# Patient Record
Sex: Male | Born: 1940 | Race: White | Hispanic: No | Marital: Married | Smoking: Never smoker
Health system: Southern US, Community
[De-identification: ages and names within clinical notes are randomized; demographics above are authoritative.]

---

## 2000-12-26 ENCOUNTER — Ambulatory Visit (HOSPITAL_COMMUNITY): Admission: RE | Admit: 2000-12-26 | Discharge: 2000-12-26 | Payer: Self-pay | Admitting: Gastroenterology

## 2019-12-01 ENCOUNTER — Other Ambulatory Visit: Payer: Self-pay

## 2019-12-01 ENCOUNTER — Other Ambulatory Visit
Admission: RE | Admit: 2019-12-01 | Discharge: 2019-12-01 | Disposition: A | Discharge status: Home / Self Care | Payer: Managed Care, Other (non HMO) | Source: Ambulatory Visit | Attending: Cardiology | Admitting: Cardiology

## 2019-12-01 DIAGNOSIS — Z20822 Contact with and (suspected) exposure to covid-19: Secondary | ICD-10-CM | POA: Diagnosis not present

## 2019-12-01 DIAGNOSIS — Z01812 Encounter for preprocedural laboratory examination: Secondary | ICD-10-CM | POA: Diagnosis not present

## 2019-12-02 LAB — SARS CORONAVIRUS 2 (TAT 6-24 HRS): SARS Coronavirus 2: NEGATIVE

## 2019-12-03 MED ORDER — SODIUM CHLORIDE 0.9 % IV SOLN
Freq: Once | INTRAVENOUS | Status: DC
  Filled 2019-12-03: qty 2

## 2019-12-04 ENCOUNTER — Other Ambulatory Visit: Payer: Self-pay

## 2019-12-04 ENCOUNTER — Observation Stay: Payer: 59

## 2019-12-04 ENCOUNTER — Encounter
Admission: AD | Disposition: A | Discharge status: Home / Self Care | Payer: Self-pay | Source: Home / Self Care | Attending: Cardiology

## 2019-12-04 ENCOUNTER — Ambulatory Visit: Payer: 59 | Admitting: Anesthesiology

## 2019-12-04 ENCOUNTER — Ambulatory Visit: Payer: 59

## 2019-12-04 ENCOUNTER — Observation Stay
Admission: AD | Admit: 2019-12-04 | Discharge: 2019-12-05 | Disposition: A | Discharge status: Home / Self Care | Payer: 59 | Attending: Cardiology | Admitting: Cardiology

## 2019-12-04 ENCOUNTER — Encounter: Payer: Self-pay | Admitting: Cardiology

## 2019-12-04 DIAGNOSIS — Z79899 Other long term (current) drug therapy: Secondary | ICD-10-CM | POA: Diagnosis not present

## 2019-12-04 DIAGNOSIS — Z87891 Personal history of nicotine dependence: Secondary | ICD-10-CM | POA: Insufficient documentation

## 2019-12-04 DIAGNOSIS — I429 Cardiomyopathy, unspecified: Secondary | ICD-10-CM | POA: Insufficient documentation

## 2019-12-04 DIAGNOSIS — Z8249 Family history of ischemic heart disease and other diseases of the circulatory system: Secondary | ICD-10-CM | POA: Diagnosis not present

## 2019-12-04 DIAGNOSIS — I495 Sick sinus syndrome: Principal | ICD-10-CM | POA: Diagnosis present

## 2019-12-04 DIAGNOSIS — I4891 Unspecified atrial fibrillation: Secondary | ICD-10-CM | POA: Diagnosis not present

## 2019-12-04 DIAGNOSIS — I4892 Unspecified atrial flutter: Secondary | ICD-10-CM | POA: Insufficient documentation

## 2019-12-04 DIAGNOSIS — Z7901 Long term (current) use of anticoagulants: Secondary | ICD-10-CM | POA: Insufficient documentation

## 2019-12-04 DIAGNOSIS — I491 Atrial premature depolarization: Secondary | ICD-10-CM | POA: Diagnosis not present

## 2019-12-04 DIAGNOSIS — Z950 Presence of cardiac pacemaker: Secondary | ICD-10-CM

## 2019-12-04 DIAGNOSIS — M069 Rheumatoid arthritis, unspecified: Secondary | ICD-10-CM | POA: Diagnosis not present

## 2019-12-04 HISTORY — PX: PACEMAKER INSERTION: SHX728

## 2019-12-04 SURGERY — INSERTION, CARDIAC PACEMAKER
Anesthesia: Choice | Laterality: Left

## 2019-12-04 MED ORDER — LACTATED RINGERS IV SOLN: INTRAVENOUS | Status: DC

## 2019-12-04 MED ORDER — PROPOFOL 500 MG/50ML IV EMUL
INTRAVENOUS | Status: DC | PRN
  Administered 2019-12-04 (×2): 20 ug via INTRAVENOUS

## 2019-12-04 MED ORDER — EPHEDRINE SULFATE 50 MG/ML IJ SOLN
INTRAMUSCULAR | Status: DC | PRN
  Administered 2019-12-04: 7.5 mg via INTRAVENOUS

## 2019-12-04 MED ORDER — PROPOFOL 10 MG/ML IV BOLUS
INTRAVENOUS | Status: AC
  Filled 2019-12-04: qty 20

## 2019-12-04 MED ORDER — METOPROLOL SUCCINATE ER 25 MG PO TB24
25.0000 mg | ORAL_TABLET | Freq: Every day | ORAL | Status: DC
  Administered 2019-12-04 – 2019-12-05 (×2): 25 mg via ORAL
  Filled 2019-12-04 (×2): qty 1

## 2019-12-04 MED ORDER — SODIUM CHLORIDE 0.9 % IV SOLN: INTRAVENOUS | Status: DC

## 2019-12-04 MED ORDER — FAMOTIDINE 20 MG PO TABS
ORAL_TABLET | ORAL | Status: AC
  Administered 2019-12-04: 20 mg via ORAL
  Filled 2019-12-04: qty 1

## 2019-12-04 MED ORDER — OXYCODONE HCL 5 MG/5ML PO SOLN: 5.0000 mg | Freq: Once | ORAL | Status: DC | PRN

## 2019-12-04 MED ORDER — CEFAZOLIN SODIUM-DEXTROSE 2-4 GM/100ML-% IV SOLN: 2.0000 g | Freq: Once | INTRAVENOUS | Status: DC

## 2019-12-04 MED ORDER — CEFAZOLIN SODIUM-DEXTROSE 2-4 GM/100ML-% IV SOLN
INTRAVENOUS | Status: AC
  Filled 2019-12-04: qty 100

## 2019-12-04 MED ORDER — LIDOCAINE 1 % OPTIME INJ - NO CHARGE
INTRAMUSCULAR | Status: DC | PRN
  Administered 2019-12-04: 30 mL

## 2019-12-04 MED ORDER — ONDANSETRON HCL 4 MG/2ML IJ SOLN: 4.0000 mg | Freq: Four times a day (QID) | INTRAMUSCULAR | Status: DC | PRN

## 2019-12-04 MED ORDER — FENTANYL CITRATE (PF) 100 MCG/2ML IJ SOLN
INTRAMUSCULAR | Status: DC | PRN
  Administered 2019-12-04 (×2): 25 ug via INTRAVENOUS

## 2019-12-04 MED ORDER — PROPOFOL 500 MG/50ML IV EMUL
INTRAVENOUS | Status: DC | PRN
  Administered 2019-12-04: 50 ug/kg/min via INTRAVENOUS

## 2019-12-04 MED ORDER — ONDANSETRON HCL 4 MG/2ML IJ SOLN: 4.0000 mg | Freq: Once | INTRAMUSCULAR | Status: DC | PRN

## 2019-12-04 MED ORDER — OXYCODONE HCL 5 MG PO TABS: 5.0000 mg | ORAL_TABLET | Freq: Once | ORAL | Status: DC | PRN

## 2019-12-04 MED ORDER — MIDAZOLAM HCL 2 MG/2ML IJ SOLN
INTRAMUSCULAR | Status: AC
  Filled 2019-12-04: qty 2

## 2019-12-04 MED ORDER — ACETAMINOPHEN 10 MG/ML IV SOLN: 1000.0000 mg | Freq: Once | INTRAVENOUS | Status: DC | PRN

## 2019-12-04 MED ORDER — GENTAMICIN SULFATE 40 MG/ML IJ SOLN
INTRAMUSCULAR | Status: AC
  Filled 2019-12-04: qty 2

## 2019-12-04 MED ORDER — FAMOTIDINE 20 MG PO TABS: 20.0000 mg | ORAL_TABLET | Freq: Once | ORAL | Status: AC

## 2019-12-04 MED ORDER — FENTANYL CITRATE (PF) 100 MCG/2ML IJ SOLN
INTRAMUSCULAR | Status: AC
  Filled 2019-12-04: qty 2

## 2019-12-04 MED ORDER — CEFAZOLIN SODIUM-DEXTROSE 2-4 GM/100ML-% IV SOLN: 2.0000 g | INTRAVENOUS | Status: DC

## 2019-12-04 MED ORDER — MIDAZOLAM HCL 2 MG/2ML IJ SOLN
INTRAMUSCULAR | Status: DC | PRN
  Administered 2019-12-04: .5 mg via INTRAVENOUS

## 2019-12-04 MED ORDER — CEFAZOLIN SODIUM-DEXTROSE 1-4 GM/50ML-% IV SOLN
1.0000 g | Freq: Four times a day (QID) | INTRAVENOUS | Status: AC
  Administered 2019-12-04 – 2019-12-05 (×3): 1 g via INTRAVENOUS
  Filled 2019-12-04 (×3): qty 50

## 2019-12-04 MED ORDER — ACETAMINOPHEN 325 MG PO TABS: 325.0000 mg | ORAL_TABLET | ORAL | Status: DC | PRN

## 2019-12-04 MED ORDER — SODIUM CHLORIDE 0.9 % IV SOLN
INTRAVENOUS | Status: DC | PRN
  Administered 2019-12-04: 12:00:00 500 mL

## 2019-12-04 MED ORDER — FENTANYL CITRATE (PF) 100 MCG/2ML IJ SOLN: 25.0000 ug | INTRAMUSCULAR | Status: DC | PRN

## 2019-12-04 MED ORDER — LIDOCAINE HCL (CARDIAC) PF 100 MG/5ML IV SOSY
PREFILLED_SYRINGE | INTRAVENOUS | Status: DC | PRN
  Administered 2019-12-04: 60 mg via INTRAVENOUS

## 2019-12-04 MED ORDER — PROPOFOL 500 MG/50ML IV EMUL
INTRAVENOUS | Status: AC
  Filled 2019-12-04: qty 50

## 2019-12-04 SURGICAL SUPPLY — 38 items
BAG DECANTER FOR FLEXI CONT (MISCELLANEOUS) ×2 IMPLANT
BRUSH SCRUB EZ  4% CHG (MISCELLANEOUS) ×1
BRUSH SCRUB EZ 4% CHG (MISCELLANEOUS) ×1 IMPLANT
CABLE SURG 12 DISP A/V CHANNEL (MISCELLANEOUS) ×4 IMPLANT
CANISTER SUCT 1200ML W/VALVE (MISCELLANEOUS) ×2 IMPLANT
CHLORAPREP W/TINT 26 (MISCELLANEOUS) ×2 IMPLANT
COVER LIGHT HANDLE STERIS (MISCELLANEOUS) ×4 IMPLANT
COVER MAYO STAND REUSABLE (DRAPES) ×2 IMPLANT
COVER WAND RF STERILE (DRAPES) ×2 IMPLANT
DRAPE C-ARM XRAY 36X54 (DRAPES) ×2 IMPLANT
DRSG TEGADERM 4X4.75 (GAUZE/BANDAGES/DRESSINGS) ×2 IMPLANT
DRSG TELFA 4X3 1S NADH ST (GAUZE/BANDAGES/DRESSINGS) ×2 IMPLANT
ELECT REM PT RETURN 9FT ADLT (ELECTROSURGICAL) ×2
ELECTRODE REM PT RTRN 9FT ADLT (ELECTROSURGICAL) ×1 IMPLANT
GLIDEWIRE STIFF .35X180X3 HYDR (WIRE) IMPLANT
GLOVE BIO SURGEON STRL SZ7.5 (GLOVE) ×2 IMPLANT
GLOVE BIO SURGEON STRL SZ8 (GLOVE) ×2 IMPLANT
GOWN STRL REUS W/ TWL LRG LVL3 (GOWN DISPOSABLE) ×1 IMPLANT
GOWN STRL REUS W/ TWL XL LVL3 (GOWN DISPOSABLE) ×1 IMPLANT
GOWN STRL REUS W/TWL LRG LVL3 (GOWN DISPOSABLE) ×1
GOWN STRL REUS W/TWL XL LVL3 (GOWN DISPOSABLE) ×1
IMMOBILIZER SHDR MD LX WHT (SOFTGOODS) IMPLANT
IMMOBILIZER SHDR XL LX WHT (SOFTGOODS) ×2 IMPLANT
INTRO PACEMAKR LEAD 9FR 13CM (INTRODUCER) ×4
INTRO PACEMKR SHEATH II 7FR (MISCELLANEOUS) ×4
INTRODUCER PACEMKR LD 9FR 13CM (INTRODUCER) ×2 IMPLANT
INTRODUCER PACEMKR SHTH II 7FR (MISCELLANEOUS) ×2 IMPLANT
IV NS 500ML (IV SOLUTION) ×1
IV NS 500ML BAXH (IV SOLUTION) ×1 IMPLANT
KIT TURNOVER KIT A (KITS) ×2 IMPLANT
LABEL OR SOLS (LABEL) ×2 IMPLANT
LEAD INGEVITY 7841 52 (Lead) ×2 IMPLANT
LEAD INGEVITY 7842 59 (Lead) ×2 IMPLANT
MARKER SKIN DUAL TIP RULER LAB (MISCELLANEOUS) ×2 IMPLANT
PACEMAKER ACCOLADE GR (Pacemaker) ×2 IMPLANT
PACK PACE INSERTION (MISCELLANEOUS) ×2 IMPLANT
PAD ONESTEP ZOLL R SERIES ADT (MISCELLANEOUS) ×2 IMPLANT
SUT SILK 0 SH 30 (SUTURE) ×6 IMPLANT

## 2019-12-04 NOTE — Anesthesia Procedure Notes (Signed)
Procedure Name: MAC Date/Time: 12/04/2019 12:15 PM Performed by: Allean Found, CRNA Pre-anesthesia Checklist: Emergency Drugs available, Suction available, Patient being monitored, Timeout performed and Patient identified Patient Re-evaluated:Patient Re-evaluated prior to induction Oxygen Delivery Method: Nasal cannula Placement Confirmation: positive ETCO2

## 2019-12-04 NOTE — Interval H&P Note (Signed)
Patient with marked sinus pauses requires dual chamber pacemaker.

## 2019-12-04 NOTE — Anesthesia Postprocedure Evaluation (Signed)
Anesthesia Post Note  Patient: Raymond Case  Procedure(s) Performed: INSERTION PACEMAKER (Left )  Patient location during evaluation: PACU Anesthesia Type: General Level of consciousness: awake and alert Pain management: pain level controlled Vital Signs Assessment: post-procedure vital signs reviewed and stable Respiratory status: spontaneous breathing, nonlabored ventilation, respiratory function stable and patient connected to nasal cannula oxygen Cardiovascular status: blood pressure returned to baseline and stable Postop Assessment: no apparent nausea or vomiting Anesthetic complications: no     Last Vitals:  Vitals:   12/04/19 1500 12/04/19 1505  BP:  132/83  Pulse: 68 (!) 59  Resp: 17 19  Temp:  36.4 C  SpO2: 100% 100%    Last Pain:  Vitals:   12/04/19 1500  TempSrc:   PainSc: 0-No pain                 Corinda Gubler

## 2019-12-04 NOTE — Plan of Care (Signed)
  Problem: Education: Goal: Understanding of CV disease, CV risk reduction, and recovery process will improve Outcome: Progressing   Problem: Cardiovascular: Goal: Vascular access site(s) Level 0-1 will be maintained Outcome: Progressing   

## 2019-12-04 NOTE — Anesthesia Preprocedure Evaluation (Signed)
Anesthesia Evaluation  Patient identified by MRN, date of birth, ID band Patient awake    Reviewed: Allergy & Precautions, NPO status , Patient's Chart, lab work & pertinent test results  History of Anesthesia Complications Negative for: history of anesthetic complications  Airway Mallampati: II  TM Distance: >3 FB Neck ROM: Full    Dental no notable dental hx. (+) Teeth Intact   Pulmonary neg pulmonary ROS, neg sleep apnea, neg COPD, Patient abstained from smoking.Not current smoker,    Pulmonary exam normal breath sounds clear to auscultation       Cardiovascular Exercise Tolerance: Good METS(-) hypertension+CHF  (-) CAD and (-) Past MI + dysrhythmias Atrial Fibrillation  Rhythm:Regular Rate:Normal - Systolic murmurs    Neuro/Psych negative neurological ROS  negative psych ROS   GI/Hepatic neg GERD  ,(+)     (-) substance abuse  ,   Endo/Other  neg diabetes  Renal/GU negative Renal ROS     Musculoskeletal  (+) Arthritis , Rheumatoid disorders,    Abdominal   Peds  Hematology   Anesthesia Other Findings History reviewed. No pertinent past medical history.  Reproductive/Obstetrics                            Anesthesia Physical Anesthesia Plan  ASA: III  Anesthesia Plan: General   Post-op Pain Management:    Induction: Intravenous  PONV Risk Score and Plan: 2 and Ondansetron and Dexamethasone  Airway Management Planned: Natural Airway and Nasal Cannula  Additional Equipment: None  Intra-op Plan:   Post-operative Plan: Extubation in OR  Informed Consent: I have reviewed the patients History and Physical, chart, labs and discussed the procedure including the risks, benefits and alternatives for the proposed anesthesia with the patient or authorized representative who has indicated his/her understanding and acceptance.     Dental advisory given  Plan Discussed with: CRNA  and Surgeon  Anesthesia Plan Comments: (Discussed risks of anesthesia with patient, including PONV, sore throat, lip/dental damage. Rare risks discussed as well, such as cardiorespiratory sequelae. Patient understands.)        Anesthesia Quick Evaluation

## 2019-12-04 NOTE — Transfer of Care (Signed)
Immediate Anesthesia Transfer of Care Note  Patient: Raymond Case  Procedure(s) Performed: INSERTION PACEMAKER (Left )  Patient Location: PACU  Anesthesia Type:MAC  Level of Consciousness: awake, alert  and oriented  Airway & Oxygen Therapy: Patient Spontanous Breathing and Patient connected to nasal cannula oxygen  Post-op Assessment: Report given to RN and Post -op Vital signs reviewed and stable  Post vital signs: Reviewed and stable  Last Vitals:  Vitals Value Taken Time  BP 130/94 12/04/19 1331  Temp 36.3 C 12/04/19 1330  Pulse 65 12/04/19 1338  Resp 22 12/04/19 1338  SpO2 100 % 12/04/19 1338  Vitals shown include unvalidated device data.  Last Pain:  Vitals:   12/04/19 1039  TempSrc: Temporal  PainSc: 0-No pain      Patients Stated Pain Goal: 0 (58/83/25 4982)  Complications: No apparent anesthesia complications

## 2019-12-04 NOTE — H&P (Signed)
Jump to Section ? Document InformationEncounter DetailsImaging ResultsLast Filed Vital SignsMiscellaneous NotesPatient ContactsPatient DemographicsPlan of TreatmentProgress NotesReason for VisitSocial HistoryVisit Diagnoses Raymond Case Encounter Summary, generated on Feb. 03, 2021February 03, 2021 Printout Information  Document Contents Document Received Date Document Source Organization  Office Visit Feb. 03, 2021February 03, 2021 [place]   Patient Demographics - 79 y.o. Male; born [birth year]  Patient Address Communication Language Race / Ethnicity Marital Status  [address] [place], [place] [address] [phone] [facility]) [phone] [facility]) [phone] (Work) [email or web address] English (Preferred) Dema Severin / Not Hispanic or Latino Married   Reason for Visit  Reason Comments  Follow-up holter   Encounter Details  Date Type Department Care Team Description  12/01/2019 Office Visit [facility]  [place], [place] [address]  [phone]  Isaias Cowman, MD  [place]  [facility]  [place], [place] [address]  [phone]  [phone] (Fax)  Atrial flutter with controlled response (CMS-HCC) (Primary Dx);  Cardiomyopathy, unspecified type (CMS-HCC);  Palpitations;  Status post catheter ablation of atrial flutter;  Sinus pause;  Pre-op exam   Social History - documented as of this encounter Tobacco Use Types Packs/Day Years Used Date  Former Smoker Cigarettes 1.5 20 Quit: 10/10/1981  Smokeless Tobacco: Never Used      Alcohol Use Drinks/Week oz/Week Comments  Yes 7 Glasses of wine  7.0    Sex Assigned at Birth Date Recorded  Male 05/26/2018 8:59 AM EDT   COVID-19 Exposure Response Date Recorded  In the last month, have you been in contact with someone who was confirmed or suspected to have Coronavirus / COVID-19? No / Unsure 12/01/2019  1:38 PM EST   Last Filed Vital Signs - documented in this encounter Vital Sign Reading Time Taken Comments  Blood Pressure 112/70 12/01/2019 1:58 PM EST   Pulse 73 12/01/2019 1:58 PM EST   Temperature - -   Respiratory Rate - -   Oxygen Saturation 95% 12/01/2019 1:58 PM EST   Inhaled Oxygen Concentration - -   Weight 87.9 kg (193 lb 12.8 oz) 12/01/2019 1:58 PM EST   Height 188 cm (6' 2" ) 12/01/2019 1:58 PM EST   Body Mass Index 24.88 12/01/2019 1:58 PM EST    Progress Notes - documented in this encounter Isaias Cowman, MD - 12/01/2019 2:00 PM EST Formatting of this note might be different from the original. Established Patient Visit   Chief Complaint: Chief Complaint  Patient presents with  . Follow-up  holter  Date of Service: 12/01/2019 Date of Birth: 1941/10/13 PCP: Valera Castle, MD  History of Present Illness: Raymond Case is a 79 y.o.male patient who returns for  1. Chronic atrial flutter 2. Mildly reduced left ventricular function 3. Rheumatoid arthritis 4. AFL Catheter ablation 11/12/2019 5. Sinus pauses  The patient reported a recent history of elevated heart rate with associated shortness of breath. He had been evaluated at [place] urgent care for Covid testing which was negative due to having shortness of breath and cough. ECG on 10/09/2019 revealed atrial flutter with variable conduction at a rate of 97 bpm. The patient was evaluated by Dr. Mylinda Latina on 10/17/2019. 72-hr Holter monitor starting on 10/17/2019 revealed predominant atrial flutter with variable rate with mean heart rate of 99 bpm with heart rate ranging from 76 to 139 bpm. There were occasional premature ventricular contractions. The patient states that the patient-recorded events were in error. 2D echocardiogram 06/26/2017 revealed LVEF of  40% with mild aortic insufficiency and tricuspid regurgitation.  The patient underwent successful catheter ablation on 11/12/2019.  Patient reports he felt good for several days, but then starting on 11/16/2019 he started experiencing intermittent episodes of apparent lightheadedness, "like a wave going across the top of my head". Denies presyncope or syncope. He called Dr. Manon Hilding office, apparently went to [facility] urgent care in [place] where EKG was performed which only revealed sinus rhythm. 72-hour Holter monitor was placed 11/18/2019, results pending. He was seen in the office 11/28/2019.Marland Kitchen ECG revealed sinus rhythm, with sinus arrhythmia, with premature atrial contractions.   Patient returns today, reports doing about the same. Patient has continued to have episodes of lightheadedness, described as a "gray veil coming down over his eyes", and episodes where he has "wave flowing from the back to the front of his head". He denies syncope. He denies exertional chest pain or shortness of breath. He denies peripheral edema. 72-hour Holter monitor revealed predominant sinus bradycardia with mean heart rate of 58 bpm, minimum heart rate of 35 bpm, frequent premature atrial contractions, occasional nonsustained atrial runs, occasional premature ventricular contractions, and frequent sinus pauses, most commonly 3 seconds, with the longest pause of 6 seconds.  Past Medical and Surgical History  Past Medical History Past Medical History:  Diagnosis Date  . Atrial fibrillation (CMS-HCC)  . Cardiomyopathy, secondary (CMS-HCC)  . 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   Medications and Allergies  Current Medications  Current Outpatient Medications  Medication Sig Dispense Refill  . adalimumab (HUMIRA PEN) 40 mg/0.8 mL pen injector kit Inject 0.8 mLs (40 mg total) subcutaneously every 14 (fourteen) days 6 each 3  . ELIQUIS  5 mg tablet TAKE 1 TABLET EVERY 12 HOURS 180 tablet 4  . loratadine (CLARITIN) 10 mg tablet Take 10 mg by mouth as needed  . metoprolol succinate (TOPROL-XL) 25 MG XL tablet TAKE 1 TABLET DAILY (Patient taking differently: 12.5 mg once daily ) 90 tablet 3   No current facility-administered medications for this visit.   Allergies: Patient has no known allergies.  Social and Family History  Social History reports that he quit smoking about 38 years ago. His smoking use included cigarettes. He has a 30.00 pack-year smoking history. He has never used smokeless tobacco. He reports current alcohol use of about 7.0 standard drinks of alcohol per week. He reports that he does not use drugs.  Family History Family History  Problem Relation Age of Onset  . Rheum arthritis Father  . Cancer Father  . Lung cancer Father  . ALS Maternal Grandmother  . Stroke Maternal Grandfather  . Coronary Artery Disease (Blocked arteries around heart) Mother  . Atrial fibrillation (Abnormal heart rhythm sometimes requiring treatment with blood thinners) Mother  . Kidney failure Mother  . High blood pressure (Hypertension) Sister  . High blood pressure (Hypertension) Brother  . Breast cancer Daughter  . Colon cancer Neg Hx  . Prostate cancer Neg Hx  . Diabetes type II Neg Hx   Review of Systems   Review of Systems: The patient denies chest pain, reports exertional shortness of breath, without orthopnea, paroxysmal nocturnal dyspnea, with mild chronic pedal edema, with palpitations and heart racing, presyncope, syncope, with joint pain. Review of 10 Systems is negative except as described above.  Physical Examination   Vitals:  BP 112/70  Pulse 73  Ht 188 cm (6' 2" )  Wt 87.9 kg (193 lb 12.8 oz)  SpO2 95%  BMI 24.88 kg/m  Ht:188 cm (6' 2" ) Wt:87.9 kg (193 lb 12.8 oz) BZJ:IRCV surface area is 2.14 meters squared. Body mass index is 24.88 kg/m.  General: Alert and oriented. Well-appearing. No acute  distress. HEENT: Pupils equally reactive to light and accomodation  Neck: Supple, no JVD Lungs: Normal effort of breathing; fine bibasilar inspiratory crepitations Heart: Irregularly irregular. No murmur, rub, or gallop Abdomen: nondistended, with normal bowel sounds Extremities: no cyanosis, clubbing, or edema Peripheral Pulses: 2+ radial bilaterally Skin: Warm, dry, no diaphoresis  Assessment   79 y.o. male with  1. Atrial flutter with controlled response (CMS-HCC)  2. Cardiomyopathy, unspecified type (CMS-HCC)  3. Palpitations  4. Status post catheter ablation of atrial flutter  5. Sinus pause   79 year old gentleman with chronic atrial flutter of unknown duration, previously completely asymptomatic and rate controlled with metoprolol, with chads vasc score of 2, on Eliquis for stroke prevention. Patient has recently experienced episodes of elevated heart rate. ECG revealed atrial flutter with variable rate of 124 bpm. He has mildly reduced left ventricular function with LVEF 40%. He was evaluated by Dr. Marcello Moores, and underwent successful catheter ablation 11/12/2019. The patient has been experiencing episodes of lightheadedness, without presyncope or syncope, EKG revealing sinus rhythm, with sinus arrhythmia, and frequent premature atrial contractions at a rate of 63 bpm. 72-hour Holter monitor revealed predominant sinus bradycardia with mean heart rate of 58 bpm, with frequent premature atrial contractions, occasional atrial runs, and frequent sinus pauses from 3 to 6 seconds.  Plan   1. Continue current medications 2. Hold metoprolol succinate 3. Hold Eliquis starting 12/03/2019 4. Proceed with dual-chamber pacemaker implantation scheduled for 12/05/2019. The risk, benefits and alternatives of dual-chamber pacemaker implantation were explained to the patient and informed written consent was obtained.  No orders of the defined types were placed in this encounter.  Return in about 1 week  (around 12/08/2019), or after pacemaker.  Isaias Cowman, MD PhD [facility]  Electronically signed by Isaias Cowman, MD at 12/01/2019 2:30 PM EST   Miscellaneous Notes - documented in this encounter Addendum Note - Richardo Priest, RN - 12/01/2019 2:00 PM EST Addended by: Sherri Sear on: 12/01/2019 02:39 PM  Modules accepted: Orders    Electronically signed by Richardo Priest, RN at 12/01/2019 2:39 PM EST   Plan of Treatment - documented as of this encounter Upcoming Encounters Upcoming Encounters  Date Type Specialty Care Team Description  12/15/2019 Nurse Only Covid Vaccination Halstater, Pura Spice, MD  [address]  [place], [place] [address]  [phone]  [phone] (Fax)    10/07/2020 Office Visit Ophthalmology Candiss Norse, [place], [place]  [place], [place] [address]  [phone]  [phone] (Fax)    11/25/2020 Office Visit Rheumatology Orrin Brigham, MD  [place], [place] [address]  [phone]  [phone] (Fax)     Scheduled Orders Scheduled Orders  Name Type Priority Associated Diagnoses Order Schedule  2019 Novel Coronavirus (CoVID-19), NAA - [facility] Routine Pre-op exam  Expected: 12/01/2019, Expires: 11/30/2020   Imaging Results - documented in this encounter  X-ray chest PA and lateral (12/01/2019 3:21 PM EST) X-ray chest PA and lateral (12/01/2019 3:21 PM EST)  Specimen     X-ray chest PA and lateral (12/01/2019 3:21 PM EST)  Narrative Performed At  This result has an attachment that  is not available.        Visit Diagnoses - documented in this encounter Diagnosis  Atrial flutter with controlled response (CMS-HCC) - Primary   Cardiomyopathy, unspecified type (CMS-HCC)   Palpitations   Status post catheter ablation of atrial flutter   Sinus pause   Pre-op exam   Images  Patient Contacts  Contact Name Contact Address Communication Relationship  to Patient  Rayan Ines Unknown [phone] [facility]) Spouse, Emergency Contact  Document Information  Primary Care Provider Other Service Providers Document Coverage Dates  Valera Castle, MD (Aug. 26, 2014August 26, 2014 - Present) [phone] (Work) [phone] (Fax) [place], [place] [address] Family Medicine [facility] [place], [place] [address]  Feb. 01, 2021February 01, 2021   [place] [address] [place], [place] [address]   Encounter Providers Encounter Date  Isaias Cowman, MD (Attending) [phone] (Work) [phone] (Fax) [facility] [place],  [address] Cardiovascular Disease Feb. 01, 2021February 01, 2021    Show All Sections

## 2019-12-04 NOTE — Op Note (Signed)
[facility] Cardiology   12/04/2019                     1:28 PM  PATIENT:  Raymond Case    PRE-OPERATIVE DIAGNOSIS:  Heart Block  POST-OPERATIVE DIAGNOSIS:  Same  PROCEDURE:  INSERTION PACEMAKER  SURGEON:  Marcina Millard, MD    ANESTHESIA:     PREOPERATIVE INDICATIONS:  [name] is a  79 y.o. male with a diagnosis of Heart Block who failed conservative measures and elected for surgical management.    The risks benefits and alternatives were discussed with the patient preoperatively including but not limited to the risks of infection, bleeding, cardiopulmonary complications, the need for revision surgery, among others, and the patient was willing to proceed.   OPERATIVE PROCEDURE: The patient was brought to the operating room in a fasting state.  The left pectoral region was prepped and draped in usual sterile manner.  Anesthesia was obtained 1% lidocaine locally.  A 6 cm incision was performed the left pectoral region.  The pacemaker pocket was generated by electrocautery and blunt dissection.  Access was obtained to the left subclavian vein under fluoroscopic guidance.  MRI compatible leads were positioned into the right ventricular apical septum [facility] [phone] ) and right atrial appendage [facility] [phone] ) under fluoroscopic guidance.  After proper thresholds were obtained the leads were sutured in place with 0 silk.  The leads were connected to a MRI compatible dual-chamber rate responsive pacemaker generator [facility] [employer] MRI [id number] / [id number] ).  The pacemaker pocket was irrigated with gentamicin solution.  The pacemaker generator was positioned into the pocket and the pocket was closed with 2-0 and 4-0 Vicryl, respectively.  Steri-Strips and pressure dressing were applied.  Postprocedural interrogation revealed appropriate dual-chamber atrial and ventricular sensing and pacing thresholds.  There were no periprocedural  complications.

## 2019-12-05 DIAGNOSIS — I495 Sick sinus syndrome: Secondary | ICD-10-CM | POA: Diagnosis not present

## 2019-12-05 MED ORDER — CEPHALEXIN 250 MG PO CAPS: 500.0000 mg | ORAL_CAPSULE | Freq: Two times a day (BID) | ORAL | 0 refills | Status: AC

## 2019-12-05 NOTE — Discharge Instructions (Signed)
Do not lift left arm above head.  Leave dressing on.  May shower 12/06/2019.  Resume Eliquis 12/06/2019.

## 2019-12-05 NOTE — Progress Notes (Signed)
Discharge instructions provided, all questitons answered.

## 2019-12-05 NOTE — Discharge Summary (Signed)
   Physician Discharge Summary  Patient ID: Raymond Case MRN: [id number] DOB/AGE: 03-02-1941 79 y.o.  Admit date: 12/04/2019 Discharge date: 12/05/2019  Primary Discharge Diagnosis sinus pauses Secondary Discharge Diagnosis presyncope  Significant Diagnostic Studies: yes  Consults: None  Hospital Course: The patient underwent elective dual-chamber pacemaker implantation on 12/04/2019 without complication.  Post procedural chest x-ray did not reveal evidence for pneumothorax.  Post procedural ECG revealed atrial pacing with ventricular sensing.  Patient had an uncomplicated hospital course, observed on telemetry which revealed predominant atrial pacing with ventricular sensing.  On the morning of 12/05/2019 the patient was clinically stable, and was discharged home for follow-up in 1 week.   Discharge Exam: Blood pressure 128/75, pulse (!) 56, temperature 98 F (36.7 C), temperature source Oral, resp. rate 16, height 6\' 2"  (1.88 m), weight 87 kg, SpO2 95 %.   General appearance: alert Head: Normocephalic, without obvious abnormality, atraumatic Eyes: conjunctivae/corneas clear. PERRL, EOM's intact. Fundi benign. Ears: normal TM's and external ear canals both ears Nose: Nares normal. Septum midline. Mucosa normal. No drainage or sinus tenderness. Throat: lips, mucosa, and tongue normal; teeth and gums normal Neck: no adenopathy, no carotid bruit, no JVD, supple, symmetrical, trachea midline and thyroid not enlarged, symmetric, no tenderness/mass/nodules Back: symmetric, no curvature. ROM normal. No CVA tenderness. Resp: clear to auscultation bilaterally Chest wall: no tenderness Breasts: normal appearance, no masses or tenderness Cardio: regular rate and rhythm, S1, S2 normal, no murmur, click, rub or gallop GI: soft, non-tender; bowel sounds normal; no masses,  no organomegaly Extremities: extremities normal, atraumatic, no cyanosis or edema Pulses: 2+ and symmetric Skin: Skin  color, texture, turgor normal. No rashes or lesions Lymph nodes: Cervical, supraclavicular, and axillary nodes normal. Neurologic: Grossly normal Incision/Wound: Well-healing Labs:  No results found for: WBC, HGB, HCT, MCV, PLT No results for input(s): NA, K, CL, CO2, BUN, CREATININE, CALCIUM, PROT, BILITOT, ALKPHOS, ALT, AST, GLUCOSE in the last 168 hours.  Invalid input(s): LABALBU    Radiology: No pneumothorax observed EKG: Atrial pacing with ventricular sensing  FOLLOW UP PLANS AND APPOINTMENTS  Allergies as of 12/05/2019   No Known Allergies     Medication List    TAKE these medications   cephALEXin 250 MG capsule Commonly known as: Keflex Take 2 capsules (500 mg total) by mouth 2 (two) times daily.   docusate sodium 100 MG capsule Commonly known as: COLACE Take 100 mg by mouth daily as needed for mild constipation.   Eliquis 5 MG Tabs tablet Generic drug: apixaban Take 5 mg by mouth 2 (two) times daily.   Humira 40 MG/0.4ML Pskt Generic drug: Adalimumab Inject 40 mg into the skin every 14 (fourteen) days. Every other Saturday   metoprolol succinate 25 MG 24 hr tablet Commonly known as: TOPROL-XL Take 12.5 mg by mouth daily.      Follow-up Information    Ginger Leeth, MD Follow up in 10 day(s).   Specialty: Cardiology Contact information: [address] [facility] [place] [phone]           BRING ALL MEDICATIONS WITH YOU TO FOLLOW UP APPOINTMENTS  Time spent with patient to include physician time: 25 minutes Signed:  [phone] MD, PhD, [facility] 12/05/2019, 8:04 AM

## 2020-07-30 IMAGING — DX DG CHEST 1V PORT
1 series · 1 of 1 positions shown · non-contrast
Comparison: None.

CLINICAL DATA: Status post pacemaker placement.

EXAM:
PORTABLE CHEST 1 VIEW

[chest ap]
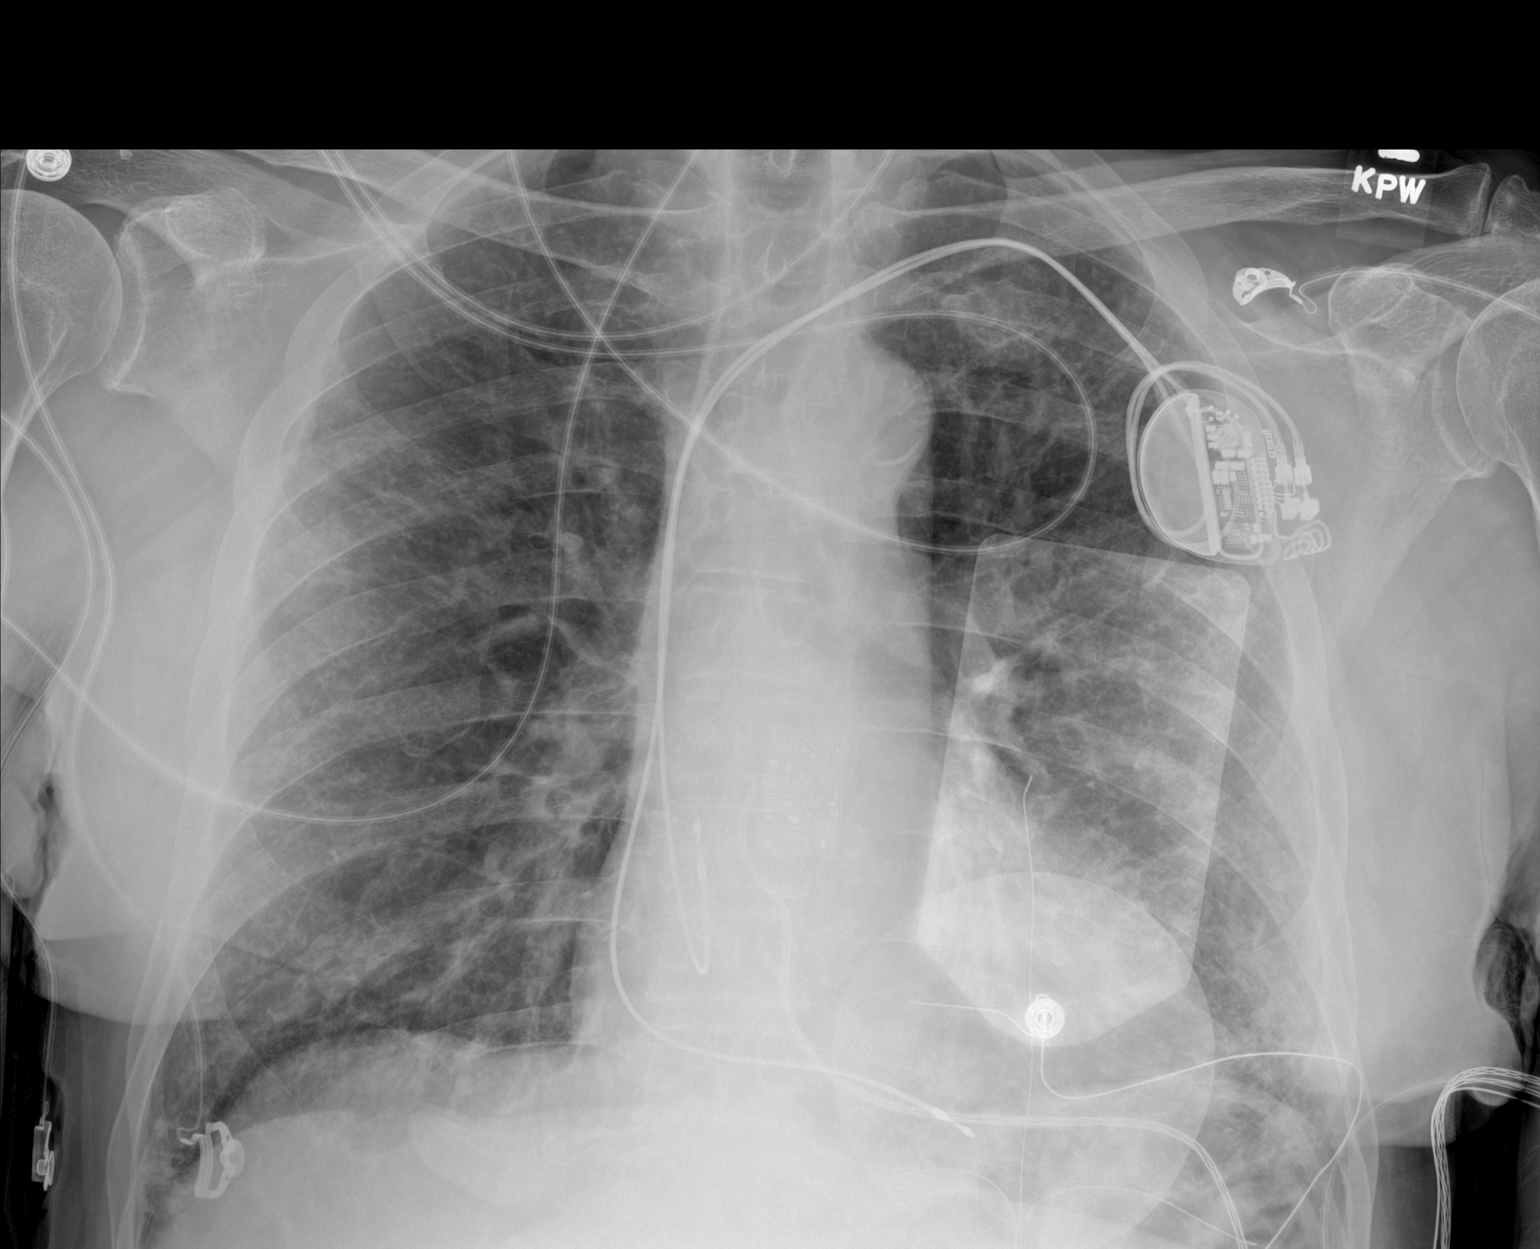

[1 of 1 positions shown; findings below may reference images not displayed]

FINDINGS: The heart size and mediastinal contours are within normal limits.
Both lungs are clear. Left-sided pacemaker is noted with leads in
grossly good position. No pneumothorax or pleural effusion is noted.
The visualized skeletal structures are unremarkable.
IMPRESSION: No acute cardiopulmonary abnormality seen. Interval placement of
left-sided pacemaker with leads in grossly good position.
